# Patient Record
Sex: Female | Born: 1985 | Hispanic: No | Marital: Married | Smoking: Never smoker
Health system: Southern US, Community
[De-identification: ages and names within clinical notes are randomized; demographics above are authoritative.]

## PROBLEM LIST (undated history)

## (undated) DIAGNOSIS — Z789 Other specified health status: Secondary | ICD-10-CM

## (undated) DIAGNOSIS — E039 Hypothyroidism, unspecified: Secondary | ICD-10-CM

## (undated) DIAGNOSIS — K219 Gastro-esophageal reflux disease without esophagitis: Secondary | ICD-10-CM

## (undated) HISTORY — PX: ECTOPIC PREGNANCY SURGERY: SHX613

---

## 2017-01-18 ENCOUNTER — Other Ambulatory Visit (HOSPITAL_COMMUNITY): Payer: Self-pay | Admitting: Obstetrics

## 2017-01-18 DIAGNOSIS — N979 Female infertility, unspecified: Secondary | ICD-10-CM

## 2017-01-24 ENCOUNTER — Encounter (HOSPITAL_COMMUNITY): Payer: Self-pay | Admitting: Radiology

## 2017-01-24 ENCOUNTER — Ambulatory Visit (HOSPITAL_COMMUNITY)
Admission: RE | Admit: 2017-01-24 | Discharge: 2017-01-24 | Disposition: A | Discharge status: Home / Self Care | Payer: BLUE CROSS/BLUE SHIELD | Source: Ambulatory Visit | Attending: Obstetrics | Admitting: Obstetrics

## 2017-01-24 DIAGNOSIS — Z8759 Personal history of other complications of pregnancy, childbirth and the puerperium: Secondary | ICD-10-CM | POA: Diagnosis not present

## 2017-01-24 DIAGNOSIS — N979 Female infertility, unspecified: Secondary | ICD-10-CM | POA: Insufficient documentation

## 2017-01-24 MED ORDER — IOPAMIDOL (ISOVUE-300) INJECTION 61%
30.0000 mL | Freq: Once | INTRAVENOUS | Status: AC | PRN
  Administered 2017-01-24: 30 mL

## 2017-06-14 LAB — OB RESULTS CONSOLE HEPATITIS B SURFACE ANTIGEN: HEP B S AG: NEGATIVE

## 2017-06-14 LAB — OB RESULTS CONSOLE ANTIBODY SCREEN: Antibody Screen: NEGATIVE

## 2017-06-14 LAB — OB RESULTS CONSOLE RUBELLA ANTIBODY, IGM: Rubella: IMMUNE

## 2017-06-14 LAB — OB RESULTS CONSOLE GC/CHLAMYDIA
Chlamydia: NEGATIVE
Gonorrhea: NEGATIVE

## 2017-06-14 LAB — OB RESULTS CONSOLE HIV ANTIBODY (ROUTINE TESTING): HIV: NONREACTIVE

## 2017-06-14 LAB — OB RESULTS CONSOLE ABO/RH: RH TYPE: POSITIVE

## 2017-06-14 LAB — OB RESULTS CONSOLE RPR: RPR: NONREACTIVE

## 2017-08-31 ENCOUNTER — Other Ambulatory Visit (HOSPITAL_COMMUNITY): Payer: Self-pay | Admitting: Obstetrics

## 2017-08-31 DIAGNOSIS — Z3689 Encounter for other specified antenatal screening: Secondary | ICD-10-CM

## 2017-08-31 DIAGNOSIS — Z3A18 18 weeks gestation of pregnancy: Secondary | ICD-10-CM

## 2017-08-31 DIAGNOSIS — O280 Abnormal hematological finding on antenatal screening of mother: Secondary | ICD-10-CM

## 2017-09-05 ENCOUNTER — Encounter (HOSPITAL_COMMUNITY): Payer: Self-pay | Admitting: *Deleted

## 2017-09-07 ENCOUNTER — Ambulatory Visit (HOSPITAL_COMMUNITY)
Admission: RE | Admit: 2017-09-07 | Discharge: 2017-09-07 | Disposition: A | Discharge status: Home / Self Care | Payer: BLUE CROSS/BLUE SHIELD | Source: Ambulatory Visit | Attending: Obstetrics | Admitting: Obstetrics

## 2017-09-07 ENCOUNTER — Encounter (HOSPITAL_COMMUNITY): Payer: Self-pay

## 2017-09-07 DIAGNOSIS — Z3A18 18 weeks gestation of pregnancy: Secondary | ICD-10-CM | POA: Insufficient documentation

## 2017-09-07 DIAGNOSIS — O280 Abnormal hematological finding on antenatal screening of mother: Secondary | ICD-10-CM | POA: Insufficient documentation

## 2017-09-07 DIAGNOSIS — Z3689 Encounter for other specified antenatal screening: Secondary | ICD-10-CM | POA: Insufficient documentation

## 2017-09-07 HISTORY — DX: Other specified health status: Z78.9

## 2017-09-07 NOTE — Progress Notes (Signed)
Genetic Counseling  High-Risk Gestation Note  Appointment Date:  09/07/2017 Referred By: Noland Fordyce, MD Date of Birth:  Apr 20, 1986 Partner: Lori [name]   Pregnancy History: [id number] Estimated Date of Delivery: 02/07/18 Estimated Gestational Age: [redacted]w[redacted]d Attending: Particia Nearing, MD   Mrs. Lori [name] and her partner, Mr. Lori [name], were seen for consultation for genetic counseling because of an elevated MSAFP of 4.39 MoMs based on maternal serum screening.     In summary:  Discussed elevated MSAFP   Reviewed history of current pregnancy  Dichorionic/diamniotic gestation initially with demise of twin B occurring at approximately 10 weeks, noted on 13 week ultrasound  Reviewed possible explanations for elevation  Twin gestation/demise of a twin is most likely explanation of elevated MSAFP in current pregnancy  Reviewed other associated explanations  Discussed additional options  Ultrasound- performed today; visualized fetal anatomy within normal limits; see separate report  Amniocentesis- declined  Discussed associations with unexplained elevated MSAFP  Follow-up growth ultrasound available in early third trimester  Reviewed family history concerns  We reviewed [name]'s maternal serum screening result, the elevation of MSAFP, and the associated reported risk for a fetal open neural tube defect.  Of note, the current pregnancy was initially a dichorionic/diamniotic twin gestation, with demise of twin B at approximately [redacted] weeks gestation.  We reviewed open neural tube defects including: the typical multifactorial etiology and variable prognosis.  In addition, we discussed alternative explanations for an elevated MSAFP including: normal variation, twins, feto-maternal bleeding, a gestational dating error, abdominal wall defects, kidney differences, oligohydramnios, and placental problems.  We discussed that an unexplained elevation of MSAFP is associated with an  increased risk for third trimester complications including: prematurity, low birth weight, and pre-eclampsia.  Given the gestational age at which demise of twin B was visualized on ultrasound and the timing in gestation of when Quad screen was performed, we discussed that the top differential diagnosis suspected for the elevated MSAFP is the demise of twin B in the current pregnancy.   We reviewed additional available screening and diagnostic options including detailed ultrasound and amniocentesis.  We discussed the risks, limitations, and benefits of each. Detailed ultrasound was performed today. Visualized fetal anatomy was within normal limits. Complete ultrasound results reported under separate cover. After thoughtful consideration of these options, [name] elected ultrasound only in the pregnancy and declined amniocentesis.  She understands that ultrasound cannot rule out all birth defects or genetic syndromes.  However, she was counseled that ~90% of fetuses with open neural tube defects can be detected by detailed second trimester ultrasound, when well visualized. A follow-up ultrasound is available to the patient to assess fetal growth in the third trimester given the elevated MSAFP.    [name] was provided with written information regarding cystic fibrosis (CF), spinal muscular atrophy (SMA) and hemoglobinopathies including the carrier frequency, availability of carrier screening and prenatal diagnosis if indicated.  In addition, we discussed that CF and hemoglobinopathies are routinely screened for as part of the [place] newborn screening panel.  She declined screening for CF, SMA and hemoglobinopathies.  Both family histories were reviewed and found to be contributory for a nephew to [name] (his brother's oldest son) who is unable to walk and requires the use of a wheelchair. His features were not described to be progressive. This relative does not have additional medical concerns. An  underlying cause is not known. No additional relatives are reported with similar features including additional children for this brother as well as  children for [name] additional 4 sisters and 4 brothers.  We discussed that there can be many causes for impact on ability to walk including genetics, environment, and multifactorial causes. The reported family history is not suggestive of a particular genetic etiology; however, an underlying genetic etiology cannot be ruled out without additional information.  Without further information regarding the provided family history, an accurate genetic risk cannot be calculated. Further genetic counseling is warranted if more information is obtained.  Mrs. Ace denied exposure to environmental toxins or chemical agents. She denied the use of alcohol, tobacco or street drugs. She denied significant viral illnesses during the course of her pregnancy.   I counseled this couple for approximately 25 minutes regarding the above risks and available options.    Quinn Plowman, MS,  Certified Genetic Counselor 09/07/2017

## 2017-09-12 ENCOUNTER — Other Ambulatory Visit (HOSPITAL_COMMUNITY): Payer: Self-pay

## 2017-11-19 ENCOUNTER — Encounter (HOSPITAL_COMMUNITY): Payer: Self-pay

## 2017-11-19 ENCOUNTER — Inpatient Hospital Stay (HOSPITAL_COMMUNITY)
Admission: AD | Admit: 2017-11-19 | Discharge: 2017-11-19 | Disposition: A | Discharge status: Home / Self Care | Payer: BLUE CROSS/BLUE SHIELD | Source: Ambulatory Visit | Attending: Obstetrics and Gynecology | Admitting: Obstetrics and Gynecology

## 2017-11-19 DIAGNOSIS — O34219 Maternal care for unspecified type scar from previous cesarean delivery: Secondary | ICD-10-CM | POA: Diagnosis not present

## 2017-11-19 DIAGNOSIS — Z79899 Other long term (current) drug therapy: Secondary | ICD-10-CM | POA: Diagnosis not present

## 2017-11-19 DIAGNOSIS — Z9889 Other specified postprocedural states: Secondary | ICD-10-CM | POA: Diagnosis not present

## 2017-11-19 DIAGNOSIS — O99513 Diseases of the respiratory system complicating pregnancy, third trimester: Secondary | ICD-10-CM | POA: Insufficient documentation

## 2017-11-19 DIAGNOSIS — J069 Acute upper respiratory infection, unspecified: Secondary | ICD-10-CM | POA: Insufficient documentation

## 2017-11-19 DIAGNOSIS — O368130 Decreased fetal movements, third trimester, not applicable or unspecified: Secondary | ICD-10-CM | POA: Insufficient documentation

## 2017-11-19 DIAGNOSIS — O368120 Decreased fetal movements, second trimester, not applicable or unspecified: Secondary | ICD-10-CM

## 2017-11-19 DIAGNOSIS — Z3A28 28 weeks gestation of pregnancy: Secondary | ICD-10-CM | POA: Insufficient documentation

## 2017-11-19 LAB — URINALYSIS, ROUTINE W REFLEX MICROSCOPIC
Bilirubin Urine: NEGATIVE
Glucose, UA: NEGATIVE mg/dL
Hgb urine dipstick: NEGATIVE
KETONES UR: NEGATIVE mg/dL
LEUKOCYTES UA: NEGATIVE
NITRITE: NEGATIVE
PH: 7 (ref 5.0–8.0)
PROTEIN: NEGATIVE mg/dL
Specific Gravity, Urine: 1.009 (ref 1.005–1.030)

## 2017-11-19 NOTE — MAU Provider Note (Signed)
History     Chief Complaint  Patient presents with  . Decreased Fetal Movement  . Headache  . Fever  . Nasal Congestion  31 yo G3P1011 MF presents at 2328 4/[redacted] weeks gestation with c/o DFM since yesterday as well as fever yesterday To 99, h/a and nasal congestion. No vaginal bleeding or leakage of fluid. Pt has felt movement since placement In the room   OB History    Gravida Para Term Preterm AB Living   3 1 1   1 1    SAB TAB Ectopic Multiple Live Births       1          Past Medical History:  Diagnosis Date  . Medical history non-contributory     Past Surgical History:  Procedure Laterality Date  . CESAREAN SECTION    . ECTOPIC PREGNANCY SURGERY      History reviewed. No pertinent family history.  Social History   Tobacco Use  . Smoking status: Never Smoker  . Smokeless tobacco: Never Used  Substance Use Topics  . Alcohol use: No  . Drug use: No    Allergies: No Known Allergies  Medications Prior to Admission  Medication Sig Dispense Refill Last Dose  . levothyroxine (SYNTHROID, LEVOTHROID) 75 MCG tablet Take 75 mcg by mouth daily before breakfast.   11/19/2017 at Unknown time  . Prenatal Vit-Fe Fumarate-FA (PRENATAL MULTIVITAMIN) TABS tablet Take 1 tablet by mouth daily at 12 noon.   11/18/2017 at Unknown time  . ranitidine (ZANTAC) 150 MG tablet Take 150 mg by mouth 2 (two) times daily as needed for heartburn.   Past Week at Unknown time     Physical Exam   Blood pressure 113/77, pulse (!) 106, temperature 98.1 F (36.7 C), temperature source Oral, resp. rate 18, last menstrual period 05/02/2017.  General appearance: alert, cooperative and no distress Lungs: clear to auscultation bilaterally Heart: regular rate and rhythm, S1, S2 normal, no murmur, click, rub or gallop Abdomen: gravid nontender Extremities: no edema, redness or tenderness in the calves or thighs   Tracing: baseline 140 (+) accel 160 good variability. One variable decel ED Course   IMP: URI( viral syndrome) affecting pregnancy DFM  IUP @ 28 4/7 weeks P) review kick cts instructions with pt. Pt advised to check on  List for safe med in  Preg. D/c home Keep sched OB appt MDM   Serita KyleSheronette A Ramaya Guile, MD 12:34 PM 11/19/2017

## 2017-11-19 NOTE — Discharge Instructions (Signed)
Daily kick counts. See office website for what can be taken in pregnancy for symptoms related to upper respiratory infection

## 2017-11-19 NOTE — MAU Note (Signed)
Reports FM yesterday evening but nothing since.  No vag bleeding, some occasional upper abdominal tightness  Congestion, fever, headache. Tried tylenol- some relief.

## 2018-01-05 ENCOUNTER — Other Ambulatory Visit: Payer: Self-pay | Admitting: Obstetrics

## 2018-01-17 ENCOUNTER — Encounter (HOSPITAL_COMMUNITY): Payer: Self-pay

## 2018-01-17 ENCOUNTER — Telehealth (HOSPITAL_COMMUNITY): Payer: Self-pay | Admitting: *Deleted

## 2018-01-17 NOTE — Telephone Encounter (Signed)
Preadmission screen  

## 2018-01-18 ENCOUNTER — Encounter (HOSPITAL_COMMUNITY): Payer: Self-pay

## 2018-01-30 NOTE — Patient Instructions (Signed)
Lori [name]  01/30/2018   Your procedure is scheduled on:  02/01/2018  Enter through the Main Entrance of [facility] at 0745 AM.  Pick up the phone at the desk and dial [phone]  Call this number if you have problems the morning of surgery:[phone]  Remember:   Do not eat food:(After Midnight) Desps de medianoche.  Do not drink clear liquids: (After Midnight) Desps de medianoche.  Take these medicines the morning of surgery with A SIP OF WATER: take your synthroid and you may take your zantac   Do not wear jewelry, make-up or nail polish.  Do not wear lotions, powders, or perfumes. Do not wear deodorant.  Do not shave 48 hours prior to surgery.  Do not bring valuables to the hospital.  [facility] is not   responsible for any belongings or valuables brought to the hospital.  Contacts, dentures or bridgework may not be worn into surgery.  Leave suitcase in the car. After surgery it may be brought to your room.  For patients admitted to the hospital, checkout time is 11:00 AM the day of              discharge.    N/A   Please read over the following fact sheets that you were given:   Surgical Site Infection Prevention

## 2018-01-31 ENCOUNTER — Encounter (HOSPITAL_COMMUNITY)
Admission: RE | Admit: 2018-01-31 | Discharge: 2018-01-31 | Disposition: A | Discharge status: Home / Self Care | Payer: BLUE CROSS/BLUE SHIELD | Source: Ambulatory Visit | Attending: Obstetrics | Admitting: Obstetrics

## 2018-01-31 HISTORY — DX: Hypothyroidism, unspecified: E03.9

## 2018-01-31 HISTORY — DX: Gastro-esophageal reflux disease without esophagitis: K21.9

## 2018-01-31 LAB — CBC
HEMATOCRIT: 38.5 % (ref 36.0–46.0)
HEMOGLOBIN: 13 g/dL (ref 12.0–15.0)
MCH: 27.8 pg (ref 26.0–34.0)
MCHC: 33.8 g/dL (ref 30.0–36.0)
MCV: 82.4 fL (ref 78.0–100.0)
Platelets: 297 10*3/uL (ref 150–400)
RBC: 4.67 MIL/uL (ref 3.87–5.11)
RDW: 15.5 % (ref 11.5–15.5)
WBC: 11 10*3/uL — ABNORMAL HIGH (ref 4.0–10.5)

## 2018-01-31 LAB — TYPE AND SCREEN
ABO/RH(D): B POS
Antibody Screen: NEGATIVE

## 2018-01-31 LAB — ABO/RH: ABO/RH(D): B POS

## 2018-01-31 NOTE — H&P (Signed)
Lori SettlerRenuka [name] is a 32 y.o. G3P1011 at 6731w1d presenting for RCS. Pt notes intermittent contractions. Good fetal movement, No vaginal bleeding, not leaking fluid.  [facility] 7 wks - Dated by LMP c/w 7 wk u/s - Femara preg, early twin preg, demise of B at 10 wks. hyperstim with significant pain in 1st trimester - prior c/s, unclear reasoning, 7 yrs ago, also cerclage at 12 wks (by pt report), advised against cerclage this pregnancy -Anxiety - Increased quad for OSB, nl MFM u/s, result likley due to vanishing twin, nl NT u/s.     Prenatal Transfer Tool  Maternal Diabetes: No Genetic Screening: Normal Maternal Ultrasounds/Referrals: Normal Fetal Ultrasounds or other Referrals:  Referred to Materal Fetal Medicine  Maternal Substance Abuse:  No Significant Maternal Medications:  None Significant Maternal Lab Results: None     OB History    Gravida Para Term Preterm AB Living   3 1 1   1 1    SAB TAB Ectopic Multiple Live Births       1         Past Medical History:  Diagnosis Date  . GERD (gastroesophageal reflux disease)   . Hypothyroidism   . Medical history non-contributory    Past Surgical History:  Procedure Laterality Date  . CESAREAN SECTION    . ECTOPIC PREGNANCY SURGERY     Family History: family history is not on file. Social History:  reports that  has never smoked. she has never used smokeless tobacco. She reports that she does not drink alcohol or use drugs.  Review of Systems - Negative except intermittent contractions and pelvic pressure   Physical Exam: Vitals:   02/01/18 0700 02/01/18 0817  BP:  (!) 128/94  Resp:  20  Temp:  (!) 97.5 F (36.4 C)  TempSrc:  Oral  Weight: 63.3 kg (139 lb 8 oz) 63 kg (139 lb)  Height:  5' (1.524 m)    Gen: well appearing, no distress Back: no CVAT Abd: gravid, NT, no RUQ pain LE: no edema, equal bilaterally, non-tender   Prenatal labs: ABO, Rh: --/--/B POS Performed at [facility],  [address] [place], [place] [address]  [phone](03/06 1125) Antibody: NEG (03/06 1122) Rubella: Immune (07/18 0000) RPR: Nonreactive (07/18 0000)  HBsAg: Negative (07/18 0000)  HIV: Non-reactive (07/18 0000)  GBS:   neg 1 hr Glucola 116 Genetic screening nl NT, elevated AFP (difficult to interpret due to 10 wk demise of twin B) Anatomy US normal  CBC    Component Value Date/Time   WBC 11.0 (H) 01/31/2018 1122   RBC 4.67 01/31/2018 1122   HGB 13.0 01/31/2018 1122   HCT 38.5 01/31/2018 1122   PLT 297 01/31/2018 1122   MCV 82.4 01/31/2018 1122   MCH 27.8 01/31/2018 1122   MCHC 33.8 01/31/2018 1122   RDW 15.5 01/31/2018 1122     Assessment/Plan: 32 y.o. G3P1011 at 2931w1d - RCS, R/B d/w pt -Pt unsure about desire for TL, recc against - Anxiety, plan SW consult PP - Hypothyroidism    Lendon ColonelKelly A Laderius Valbuena 01/31/2018, 8:55 PM

## 2018-02-01 ENCOUNTER — Inpatient Hospital Stay (HOSPITAL_COMMUNITY): Payer: BLUE CROSS/BLUE SHIELD | Admitting: Anesthesiology

## 2018-02-01 ENCOUNTER — Encounter (HOSPITAL_COMMUNITY)
Admission: AD | Disposition: A | Discharge status: Home / Self Care | Payer: Self-pay | Source: Ambulatory Visit | Attending: Obstetrics

## 2018-02-01 ENCOUNTER — Other Ambulatory Visit: Payer: Self-pay

## 2018-02-01 ENCOUNTER — Encounter (HOSPITAL_COMMUNITY): Payer: Self-pay | Admitting: *Deleted

## 2018-02-01 ENCOUNTER — Inpatient Hospital Stay (HOSPITAL_COMMUNITY)
Admission: AD | Admit: 2018-02-01 | Discharge: 2018-02-03 | DRG: 787 | Disposition: A | Discharge status: Home / Self Care | Payer: BLUE CROSS/BLUE SHIELD | Source: Ambulatory Visit | Attending: Obstetrics | Admitting: Obstetrics

## 2018-02-01 DIAGNOSIS — K219 Gastro-esophageal reflux disease without esophagitis: Secondary | ICD-10-CM | POA: Diagnosis present

## 2018-02-01 DIAGNOSIS — D62 Acute posthemorrhagic anemia: Secondary | ICD-10-CM | POA: Diagnosis not present

## 2018-02-01 DIAGNOSIS — O99284 Endocrine, nutritional and metabolic diseases complicating childbirth: Secondary | ICD-10-CM | POA: Diagnosis present

## 2018-02-01 DIAGNOSIS — O34211 Maternal care for low transverse scar from previous cesarean delivery: Principal | ICD-10-CM | POA: Diagnosis present

## 2018-02-01 DIAGNOSIS — O9081 Anemia of the puerperium: Secondary | ICD-10-CM | POA: Diagnosis not present

## 2018-02-01 DIAGNOSIS — Z3A39 39 weeks gestation of pregnancy: Secondary | ICD-10-CM

## 2018-02-01 DIAGNOSIS — Z98891 History of uterine scar from previous surgery: Secondary | ICD-10-CM

## 2018-02-01 DIAGNOSIS — E039 Hypothyroidism, unspecified: Secondary | ICD-10-CM | POA: Diagnosis present

## 2018-02-01 DIAGNOSIS — O9962 Diseases of the digestive system complicating childbirth: Secondary | ICD-10-CM | POA: Diagnosis present

## 2018-02-01 LAB — RPR: RPR Ser Ql: NONREACTIVE

## 2018-02-01 SURGERY — Surgical Case
Anesthesia: Spinal

## 2018-02-01 MED ORDER — SIMETHICONE 80 MG PO CHEW
80.0000 mg | CHEWABLE_TABLET | Freq: Three times a day (TID) | ORAL | Status: DC
  Administered 2018-02-01 – 2018-02-03 (×5): 80 mg via ORAL
  Filled 2018-02-01 (×5): qty 1

## 2018-02-01 MED ORDER — OXYTOCIN 10 UNIT/ML IJ SOLN
INTRAMUSCULAR | Status: AC
  Filled 2018-02-01: qty 4

## 2018-02-01 MED ORDER — SODIUM CHLORIDE 0.9 % IR SOLN
Status: DC | PRN
  Administered 2018-02-01: 1

## 2018-02-01 MED ORDER — FAMOTIDINE 20 MG PO TABS
20.0000 mg | ORAL_TABLET | Freq: Two times a day (BID) | ORAL | Status: DC
  Administered 2018-02-02 – 2018-02-03 (×4): 20 mg via ORAL
  Filled 2018-02-01 (×4): qty 1

## 2018-02-01 MED ORDER — LACTATED RINGERS IV SOLN
INTRAVENOUS | Status: DC | PRN
  Administered 2018-02-01 (×2): via INTRAVENOUS

## 2018-02-01 MED ORDER — DIPHENHYDRAMINE HCL 50 MG/ML IJ SOLN: 12.5000 mg | INTRAMUSCULAR | Status: DC | PRN

## 2018-02-01 MED ORDER — KETOROLAC TROMETHAMINE 30 MG/ML IJ SOLN
INTRAMUSCULAR | Status: AC
  Filled 2018-02-01: qty 1

## 2018-02-01 MED ORDER — PHENYLEPHRINE 8 MG IN D5W 100 ML (0.08MG/ML) PREMIX OPTIME
INJECTION | INTRAVENOUS | Status: DC | PRN
  Administered 2018-02-01: 60 ug/min via INTRAVENOUS

## 2018-02-01 MED ORDER — SCOPOLAMINE 1 MG/3DAYS TD PT72
1.0000 | MEDICATED_PATCH | Freq: Once | TRANSDERMAL | Status: DC
  Administered 2018-02-01: 1.5 mg via TRANSDERMAL

## 2018-02-01 MED ORDER — DEXAMETHASONE SODIUM PHOSPHATE 4 MG/ML IJ SOLN
INTRAMUSCULAR | Status: AC
  Filled 2018-02-01: qty 1

## 2018-02-01 MED ORDER — NALOXONE HCL 4 MG/10ML IJ SOLN
1.0000 ug/kg/h | INTRAVENOUS | Status: DC | PRN
  Filled 2018-02-01: qty 5

## 2018-02-01 MED ORDER — FENTANYL CITRATE (PF) 100 MCG/2ML IJ SOLN
INTRAMUSCULAR | Status: DC | PRN
  Administered 2018-02-01: 10 ug via INTRATHECAL

## 2018-02-01 MED ORDER — ONDANSETRON HCL 4 MG/2ML IJ SOLN: 4.0000 mg | Freq: Three times a day (TID) | INTRAMUSCULAR | Status: DC | PRN

## 2018-02-01 MED ORDER — BUPIVACAINE IN DEXTROSE 0.75-8.25 % IT SOLN
INTRATHECAL | Status: DC | PRN
  Administered 2018-02-01: 1.4 mg via INTRATHECAL

## 2018-02-01 MED ORDER — SOD CITRATE-CITRIC ACID 500-334 MG/5ML PO SOLN
ORAL | Status: AC
  Filled 2018-02-01: qty 15

## 2018-02-01 MED ORDER — PHENYLEPHRINE 8 MG IN D5W 100 ML (0.08MG/ML) PREMIX OPTIME
INJECTION | INTRAVENOUS | Status: AC
  Filled 2018-02-01: qty 100

## 2018-02-01 MED ORDER — WITCH HAZEL-GLYCERIN EX PADS: 1.0000 "application " | MEDICATED_PAD | CUTANEOUS | Status: DC | PRN

## 2018-02-01 MED ORDER — DIPHENHYDRAMINE HCL 25 MG PO CAPS: 25.0000 mg | ORAL_CAPSULE | ORAL | Status: DC | PRN

## 2018-02-01 MED ORDER — LACTATED RINGERS IV SOLN: INTRAVENOUS | Status: DC

## 2018-02-01 MED ORDER — IBUPROFEN 600 MG PO TABS
600.0000 mg | ORAL_TABLET | Freq: Four times a day (QID) | ORAL | Status: DC
  Administered 2018-02-01 – 2018-02-03 (×8): 600 mg via ORAL
  Filled 2018-02-01 (×9): qty 1

## 2018-02-01 MED ORDER — FAMOTIDINE IN NACL 20-0.9 MG/50ML-% IV SOLN: 20.0000 mg | Freq: Once | INTRAVENOUS | Status: DC

## 2018-02-01 MED ORDER — NALBUPHINE HCL 10 MG/ML IJ SOLN: 5.0000 mg | Freq: Once | INTRAMUSCULAR | Status: DC | PRN

## 2018-02-01 MED ORDER — ZOLPIDEM TARTRATE 5 MG PO TABS: 5.0000 mg | ORAL_TABLET | Freq: Every evening | ORAL | Status: DC | PRN

## 2018-02-01 MED ORDER — MORPHINE SULFATE (PF) 0.5 MG/ML IJ SOLN
INTRAMUSCULAR | Status: AC
  Filled 2018-02-01: qty 10

## 2018-02-01 MED ORDER — NALBUPHINE HCL 10 MG/ML IJ SOLN: 5.0000 mg | INTRAMUSCULAR | Status: DC | PRN

## 2018-02-01 MED ORDER — NALOXONE HCL 0.4 MG/ML IJ SOLN: 0.4000 mg | INTRAMUSCULAR | Status: DC | PRN

## 2018-02-01 MED ORDER — DIBUCAINE 1 % RE OINT: 1.0000 "application " | TOPICAL_OINTMENT | RECTAL | Status: DC | PRN

## 2018-02-01 MED ORDER — ACETAMINOPHEN 325 MG PO TABS
650.0000 mg | ORAL_TABLET | ORAL | Status: DC | PRN
  Administered 2018-02-02: 650 mg via ORAL
  Filled 2018-02-01 (×2): qty 2

## 2018-02-01 MED ORDER — KETOROLAC TROMETHAMINE 30 MG/ML IJ SOLN: 30.0000 mg | Freq: Four times a day (QID) | INTRAMUSCULAR | Status: DC | PRN

## 2018-02-01 MED ORDER — FAMOTIDINE 20 MG PO TABS: 20.0000 mg | ORAL_TABLET | Freq: Once | ORAL | Status: DC

## 2018-02-01 MED ORDER — SOD CITRATE-CITRIC ACID 500-334 MG/5ML PO SOLN
30.0000 mL | Freq: Once | ORAL | Status: AC
  Administered 2018-02-01: 30 mL via ORAL

## 2018-02-01 MED ORDER — SIMETHICONE 80 MG PO CHEW: 80.0000 mg | CHEWABLE_TABLET | ORAL | Status: DC | PRN

## 2018-02-01 MED ORDER — PHENYLEPHRINE 40 MCG/ML (10ML) SYRINGE FOR IV PUSH (FOR BLOOD PRESSURE SUPPORT)
PREFILLED_SYRINGE | INTRAVENOUS | Status: AC
  Filled 2018-02-01: qty 10

## 2018-02-01 MED ORDER — FENTANYL CITRATE (PF) 100 MCG/2ML IJ SOLN: 25.0000 ug | INTRAMUSCULAR | Status: DC | PRN

## 2018-02-01 MED ORDER — CEFAZOLIN SODIUM-DEXTROSE 2-4 GM/100ML-% IV SOLN
2.0000 g | INTRAVENOUS | Status: AC
  Administered 2018-02-01: 2 g via INTRAVENOUS

## 2018-02-01 MED ORDER — LEVOTHYROXINE SODIUM 75 MCG PO TABS
75.0000 ug | ORAL_TABLET | Freq: Every day | ORAL | Status: DC
  Administered 2018-02-02 – 2018-02-03 (×2): 75 ug via ORAL
  Filled 2018-02-01 (×3): qty 1

## 2018-02-01 MED ORDER — DEXAMETHASONE SODIUM PHOSPHATE 4 MG/ML IJ SOLN
INTRAMUSCULAR | Status: DC | PRN
  Administered 2018-02-01: 4 mg via INTRAVENOUS

## 2018-02-01 MED ORDER — FAMOTIDINE IN NACL 20-0.9 MG/50ML-% IV SOLN
INTRAVENOUS | Status: AC
  Administered 2018-02-01: 20 mg
  Filled 2018-02-01: qty 50

## 2018-02-01 MED ORDER — DIPHENHYDRAMINE HCL 25 MG PO CAPS: 25.0000 mg | ORAL_CAPSULE | Freq: Four times a day (QID) | ORAL | Status: DC | PRN

## 2018-02-01 MED ORDER — MORPHINE SULFATE (PF) 0.5 MG/ML IJ SOLN
INTRAMUSCULAR | Status: DC | PRN
  Administered 2018-02-01: .2 mg via INTRATHECAL

## 2018-02-01 MED ORDER — COCONUT OIL OIL
1.0000 "application " | TOPICAL_OIL | Status: DC | PRN
  Administered 2018-02-03: 1 via TOPICAL
  Filled 2018-02-01: qty 120

## 2018-02-01 MED ORDER — KETOROLAC TROMETHAMINE 30 MG/ML IJ SOLN
30.0000 mg | Freq: Four times a day (QID) | INTRAMUSCULAR | Status: DC | PRN
  Administered 2018-02-01: 30 mg via INTRAMUSCULAR

## 2018-02-01 MED ORDER — PRENATAL MULTIVITAMIN CH
1.0000 | ORAL_TABLET | Freq: Every day | ORAL | Status: DC
  Administered 2018-02-02 – 2018-02-03 (×2): 1 via ORAL
  Filled 2018-02-01 (×2): qty 1

## 2018-02-01 MED ORDER — MEPERIDINE HCL 25 MG/ML IJ SOLN: 6.2500 mg | INTRAMUSCULAR | Status: DC | PRN

## 2018-02-01 MED ORDER — OXYCODONE HCL 5 MG/5ML PO SOLN: 5.0000 mg | Freq: Once | ORAL | Status: DC | PRN

## 2018-02-01 MED ORDER — SCOPOLAMINE 1 MG/3DAYS TD PT72
MEDICATED_PATCH | TRANSDERMAL | Status: AC
  Filled 2018-02-01: qty 1

## 2018-02-01 MED ORDER — SENNOSIDES-DOCUSATE SODIUM 8.6-50 MG PO TABS
2.0000 | ORAL_TABLET | ORAL | Status: DC
  Administered 2018-02-02 (×2): 2 via ORAL
  Filled 2018-02-01 (×2): qty 2

## 2018-02-01 MED ORDER — LACTATED RINGERS IV SOLN
INTRAVENOUS | Status: DC
  Administered 2018-02-01: 14:00:00 via INTRAVENOUS

## 2018-02-01 MED ORDER — TETANUS-DIPHTH-ACELL PERTUSSIS 5-2.5-18.5 LF-MCG/0.5 IM SUSP: 0.5000 mL | Freq: Once | INTRAMUSCULAR | Status: DC

## 2018-02-01 MED ORDER — ONDANSETRON HCL 4 MG/2ML IJ SOLN
INTRAMUSCULAR | Status: DC | PRN
  Administered 2018-02-01: 4 mg via INTRAVENOUS

## 2018-02-01 MED ORDER — SCOPOLAMINE 1 MG/3DAYS TD PT72
1.0000 | MEDICATED_PATCH | Freq: Once | TRANSDERMAL | Status: DC
  Filled 2018-02-01: qty 1

## 2018-02-01 MED ORDER — FENTANYL CITRATE (PF) 100 MCG/2ML IJ SOLN
INTRAMUSCULAR | Status: AC
  Filled 2018-02-01: qty 2

## 2018-02-01 MED ORDER — SODIUM CHLORIDE 0.9% FLUSH: 3.0000 mL | INTRAVENOUS | Status: DC | PRN

## 2018-02-01 MED ORDER — ONDANSETRON HCL 4 MG/2ML IJ SOLN
INTRAMUSCULAR | Status: AC
  Filled 2018-02-01: qty 2

## 2018-02-01 MED ORDER — OXYCODONE HCL 5 MG PO TABS: 5.0000 mg | ORAL_TABLET | Freq: Once | ORAL | Status: DC | PRN

## 2018-02-01 MED ORDER — OXYTOCIN 40 UNITS IN LACTATED RINGERS INFUSION - SIMPLE MED: 2.5000 [IU]/h | INTRAVENOUS | Status: DC

## 2018-02-01 MED ORDER — PROMETHAZINE HCL 25 MG/ML IJ SOLN: 6.2500 mg | INTRAMUSCULAR | Status: DC | PRN

## 2018-02-01 MED ORDER — PHENYLEPHRINE HCL 10 MG/ML IJ SOLN
INTRAMUSCULAR | Status: DC | PRN
  Administered 2018-02-01 (×5): 80 ug via INTRAVENOUS

## 2018-02-01 MED ORDER — OXYTOCIN 10 UNIT/ML IJ SOLN
INTRAVENOUS | Status: DC | PRN
  Administered 2018-02-01: 40 [IU] via INTRAVENOUS

## 2018-02-01 MED ORDER — LACTATED RINGERS IV SOLN
INTRAVENOUS | Status: DC | PRN
  Administered 2018-02-01: 10:00:00 via INTRAVENOUS

## 2018-02-01 MED ORDER — MENTHOL 3 MG MT LOZG: 1.0000 | LOZENGE | OROMUCOSAL | Status: DC | PRN

## 2018-02-01 MED ORDER — SIMETHICONE 80 MG PO CHEW
80.0000 mg | CHEWABLE_TABLET | ORAL | Status: DC
  Administered 2018-02-02 (×2): 80 mg via ORAL
  Filled 2018-02-01 (×2): qty 1

## 2018-02-01 SURGICAL SUPPLY — 42 items
BENZOIN TINCTURE PRP APPL 2/3 (GAUZE/BANDAGES/DRESSINGS) ×3 IMPLANT
CHLORAPREP W/TINT 26ML (MISCELLANEOUS) ×3 IMPLANT
CLAMP CORD UMBIL (MISCELLANEOUS) IMPLANT
CLOSURE STERI-STRIP 1/2X4 (GAUZE/BANDAGES/DRESSINGS) ×1
CLOSURE WOUND 1/2 X4 (GAUZE/BANDAGES/DRESSINGS)
CLOTH BEACON ORANGE TIMEOUT ST (SAFETY) ×3 IMPLANT
CLSR STERI-STRIP ANTIMIC 1/2X4 (GAUZE/BANDAGES/DRESSINGS) ×2 IMPLANT
DRSG OPSITE POSTOP 4X10 (GAUZE/BANDAGES/DRESSINGS) ×3 IMPLANT
ELECT REM PT RETURN 9FT ADLT (ELECTROSURGICAL) ×3
ELECTRODE REM PT RTRN 9FT ADLT (ELECTROSURGICAL) ×1 IMPLANT
EXTRACTOR VACUUM M CUP 4 TUBE (SUCTIONS) IMPLANT
EXTRACTOR VACUUM M CUP 4' TUBE (SUCTIONS)
GAUZE SPONGE 4X4 12PLY STRL (GAUZE/BANDAGES/DRESSINGS) ×3 IMPLANT
GLOVE BIO SURGEON STRL SZ 6.5 (GLOVE) ×2 IMPLANT
GLOVE BIO SURGEONS STRL SZ 6.5 (GLOVE) ×1
GLOVE BIOGEL PI IND STRL 7.0 (GLOVE) ×2 IMPLANT
GLOVE BIOGEL PI INDICATOR 7.0 (GLOVE) ×4
GOWN STRL REUS W/TWL LRG LVL3 (GOWN DISPOSABLE) ×6 IMPLANT
HEMOSTAT ARISTA ABSORB 3G PWDR (MISCELLANEOUS) ×3 IMPLANT
KIT ABG SYR 3ML LUER SLIP (SYRINGE) IMPLANT
NEEDLE HYPO 22GX1.5 SAFETY (NEEDLE) IMPLANT
NEEDLE HYPO 25X5/8 SAFETYGLIDE (NEEDLE) IMPLANT
NS IRRIG 1000ML POUR BTL (IV SOLUTION) ×3 IMPLANT
PACK C SECTION WH (CUSTOM PROCEDURE TRAY) ×3 IMPLANT
PAD ABD 7.5X8 STRL (GAUZE/BANDAGES/DRESSINGS) ×3 IMPLANT
PAD OB MATERNITY 4.3X12.25 (PERSONAL CARE ITEMS) ×3 IMPLANT
PENCIL SMOKE EVAC W/HOLSTER (ELECTROSURGICAL) ×3 IMPLANT
STRIP CLOSURE SKIN 1/2X4 (GAUZE/BANDAGES/DRESSINGS) IMPLANT
SUT MON AB 4-0 PS1 27 (SUTURE) ×3 IMPLANT
SUT PLAIN 0 NONE (SUTURE) IMPLANT
SUT PLAIN 2 0 (SUTURE) ×2
SUT PLAIN 2 0 XLH (SUTURE) IMPLANT
SUT PLAIN ABS 2-0 CT1 27XMFL (SUTURE) ×1 IMPLANT
SUT VIC AB 0 CT1 36 (SUTURE) ×6 IMPLANT
SUT VIC AB 0 CTX 36 (SUTURE) ×6
SUT VIC AB 0 CTX36XBRD ANBCTRL (SUTURE) ×3 IMPLANT
SUT VIC AB 2-0 CT1 27 (SUTURE) ×2
SUT VIC AB 2-0 CT1 TAPERPNT 27 (SUTURE) ×1 IMPLANT
SYR CONTROL 10ML LL (SYRINGE) IMPLANT
TAPE CLOTH SURG 4X10 WHT LF (GAUZE/BANDAGES/DRESSINGS) ×3 IMPLANT
TOWEL OR 17X24 6PK STRL BLUE (TOWEL DISPOSABLE) ×3 IMPLANT
TRAY FOLEY BAG SILVER LF 14FR (SET/KITS/TRAYS/PACK) IMPLANT

## 2018-02-01 NOTE — Anesthesia Postprocedure Evaluation (Signed)
Anesthesia Post Note  Patient: Lori SettlerRenuka Kinnard  Procedure(s) Performed: Repeat CESAREAN SECTION (N/A )     Patient location during evaluation: PACU Anesthesia Type: Spinal Level of consciousness: awake and alert Pain management: pain level controlled Vital Signs Assessment: post-procedure vital signs reviewed and stable Respiratory status: spontaneous breathing and respiratory function stable Cardiovascular status: blood pressure returned to baseline and stable Postop Assessment: spinal receding and no apparent nausea or vomiting Anesthetic complications: no    Last Vitals:  Vitals:   02/01/18 1215 02/01/18 1230  BP: 117/80 109/74  Pulse: (!) 102 (!) 103  Resp: (!) 25 (!) 22  Temp: 36.5 C   SpO2: 99% 99%    Last Pain:  Vitals:   02/01/18 1230  TempSrc:   PainSc: 0-No pain   Pain Goal:                 [name]

## 2018-02-01 NOTE — Op Note (Signed)
02/01/2018  11:03 AM  PATIENT:  Lori [name]  32 y.o. female  PRE-OPERATIVE DIAGNOSIS:  Previous Cesarean Section   POST-OPERATIVE DIAGNOSIS:  Previous Cesarean Section  PROCEDURE:  Procedure(s) with comments: Repeat CESAREAN SECTION (N/A) - EDD: 02/07/18  Repeat low transverse cesarean section with 2 layer closure  SURGEON:  Surgeon(s) and Role:    Noland Fordyce, MD - Primary  PHYSICIAN ASSISTANT:   ASSISTANTS: Mable Fill, CNM   ANESTHESIA:   spinal  EBL:  1172 mL   BLOOD ADMINISTERED:none  DRAINS: Urinary Catheter (Foley)   LOCAL MEDICATIONS USED:  NONE  SPECIMEN:  Source of Specimen:  Placenta  DISPOSITION OF SPECIMEN:  Labor and delivery  COUNTS:  YES  TOURNIQUET:  * No tourniquets in log *  DICTATION: .Note written in EPIC  PLAN OF CARE: Admit to inpatient   PATIENT DISPOSITION:  PACU - hemodynamically stable.   Delay start of Pharmacological VTE agent (>24hrs) due to surgical blood loss or risk of bleeding: yes     Findings:  @BABYSEXEBC @ infant,  APGAR (1 MIN): 9   APGAR (5 MINS): 10   APGAR (10 MINS):   Normal uterus, tubes and ovaries, normal placenta. 3VC, clear amniotic fluid  EBL: Per anesthesia records cc Antibiotics:   2g Ancef Complications: none  Indications: This is a 32 y.o. year-old, G3 P1  At [redacted]w[redacted]d admitted for repeat cesarean section. Risks benefits and alternatives of the procedure were discussed with the patient who agreed to proceed  Procedure:  After informed consent was obtained the patient was taken to the operating room where spinal anesthesia was initiated.  She was prepped and draped in the normal sterile fashion in dorsal supine position with a leftward tilt.  A foley catheter was in place.  A Pfannenstiel skin incision was made 2 cm above the pubic symphysis in the midline with the scalpel.  Dissection was carried down with the Bovie cautery until the fascia was reached. The fascia was incised in the midline. The  incision was extended laterally with the Mayo scissors. The inferior aspect of the fascial incision was grasped with the Coker clamps, elevated up and the underlying rectus muscles were dissected off sharply. The superior aspect of the fascial incision was grasped with the Coker clamps elevated up and the underlying rectus muscles were dissected off sharply.  The peritoneum was entered sharply. The peritoneal incision was extended superiorly and inferiorly with good visualization of the bladder. The bladder blade was inserted and palpation was done to assess the fetal position and the location of the uterine vessels. The lower segment of the uterus was incised sharply with the scalpel and extended  bluntly in the cephalo-caudal fashion. The infant was grasped, brought to the incision,  rotated and the infant was delivered with fundal pressure. The nose and mouth were bulb suctioned. The cord was clamped and cut after 1 minute delay. The infant was handed off to the waiting pediatrician. The placenta was expressed. The uterus was exteriorized. The uterus was cleared of all clots and debris. The uterine incision was repaired with 0 Vicryl in a running locked fashion. Brisk bleeding was noted from the right angle in the midpoint of the incision. Several figure-of-eight sutures were used to slow the bleeding. A second layer of the same suture was used in an imbricating fashion to obtain excellent hemostasis. Significant adhesions were noted around the right fallopian tube which was twisted. Given the patient's right lower quadrant pain through the cesarean section some  of these adhesions were taken down with combination of Metzenbaum scissors and Bovie cautery on cut settings. Hemostasis was noted in this area was checked several times. The mid isthmic portion of the right fallopian tube was absent, likely from prior ectopic surgery.  The uterus was then returned to the abdomen, the gutters were cleared of all clots and  debris. The uterine incision was reinspected and found to be hemostatic. The peritoneum was grasped and closed with 2-0 Vicryl in a running fashion. Midway through the peritoneal closure bright red blood was noted filling the pelvis. Repeat assessment of the incision found the incision to be hemostatic. The angles were addressed carefully. About 10 minutes was spent assessing for a source of bleeding. The right adnexa was reevaluated and no bleeding was  Seen. The underside of the bladder was assessed and no active bleeding either. No bleeders were identified. Bleeding was slowed and Arista was placed along the uterine incision and in the right adnexal region The cut muscle edges and the underside of the fascia were inspected and found to be hemostatic. The fascia was closed with 0 Vicryl in a single layer . The subcutaneous tissue was irrigated. Scarpa's layer was closed with a 2-0 plain gut suture. The skin was closed with a 4-0 Monocryl in a single layer. The patient tolerated the procedure well. Sponge lap and needle counts were correct x3 and patient was taken to the recovery room in a stable condition.  Lendon ColonelKelly A Autumn Pruitt 02/01/2018 11:05 AM

## 2018-02-01 NOTE — Brief Op Note (Signed)
02/01/2018  11:03 AM  PATIENT:  Lori [name]  32 y.o. female  PRE-OPERATIVE DIAGNOSIS:  Previous Cesarean Section   POST-OPERATIVE DIAGNOSIS:  Previous Cesarean Section  PROCEDURE:  Procedure(s) with comments: Repeat CESAREAN SECTION (N/A) - EDD: 02/07/18  Repeat low transverse cesarean section with 2 layer closure  SURGEON:  Surgeon(s) and Role:    Noland Fordyce* Deaunna Olarte, MD - Primary  PHYSICIAN ASSISTANT:   ASSISTANTS: Lori [name], Lori [name]   ANESTHESIA:   spinal  EBL:  1172 mL   BLOOD ADMINISTERED:none  DRAINS: Urinary Catheter (Foley)   LOCAL MEDICATIONS USED:  NONE  SPECIMEN:  Source of Specimen:  Placenta  DISPOSITION OF SPECIMEN:  Labor and delivery  COUNTS:  YES  TOURNIQUET:  * No tourniquets in log *  DICTATION: .Note written in EPIC  PLAN OF CARE: Admit to inpatient   PATIENT DISPOSITION:  PACU - hemodynamically stable.   Delay start of Pharmacological VTE agent (>24hrs) due to surgical blood loss or risk of bleeding: yes  Lendon ColonelKelly A Aideliz Garmany 02/01/2018 11:05 AM

## 2018-02-01 NOTE — Addendum Note (Signed)
Addendum  created 02/01/18 1624 by Elgie CongoMalinova, Elayah Klooster H, CRNA   Sign clinical note

## 2018-02-01 NOTE — Anesthesia Postprocedure Evaluation (Signed)
Anesthesia Post Note  Patient: Lori SettlerRenuka Spanos  Procedure(s) Performed: Repeat CESAREAN SECTION (N/A )     Patient location during evaluation: Mother Baby Anesthesia Type: Spinal Level of consciousness: awake and alert Pain management: pain level controlled Vital Signs Assessment: post-procedure vital signs reviewed and stable Respiratory status: spontaneous breathing, nonlabored ventilation and respiratory function stable Cardiovascular status: stable Postop Assessment: no headache, no backache, spinal receding, adequate PO intake, no apparent nausea or vomiting and patient able to bend at knees Anesthetic complications: no    Last Vitals:  Vitals:   02/01/18 1405 02/01/18 1536  BP: 114/75 110/71  Pulse: 86 90  Resp: 18 18  Temp: 36.9 C 36.9 C  SpO2: 98% 97%    Last Pain:  Vitals:   02/01/18 1536  TempSrc: Axillary  PainSc:    Pain Goal:                 [name]

## 2018-02-01 NOTE — Anesthesia Preprocedure Evaluation (Signed)
Anesthesia Evaluation  Patient identified by MRN, date of birth, ID band Patient awake    Reviewed: Allergy & Precautions, NPO status , Patient's Chart, lab work & pertinent test results  Airway Mallampati: II  TM Distance: >3 FB Neck ROM: Full    Dental  (+) Dental Advisory Given   Pulmonary neg pulmonary ROS,    Pulmonary exam normal breath sounds clear to auscultation       Cardiovascular negative cardio ROS Normal cardiovascular exam Rhythm:Regular Rate:Normal     Neuro/Psych negative neurological ROS  negative psych ROS   GI/Hepatic Neg liver ROS, GERD  Controlled and Medicated,  Endo/Other  Hypothyroidism   Renal/GU negative Renal ROS  negative genitourinary   Musculoskeletal negative musculoskeletal ROS (+)   Abdominal   Peds  Hematology negative hematology ROS (+)   Anesthesia Other Findings   Reproductive/Obstetrics (+) Pregnancy                             Anesthesia Physical Anesthesia Plan  ASA: II  Anesthesia Plan: Spinal   Post-op Pain Management:    Induction:   PONV Risk Score and Plan: Treatment may vary due to age or medical condition  Airway Management Planned: Natural Airway and Nasal Cannula  Additional Equipment: None  Intra-op Plan:   Post-operative Plan:   Informed Consent: I have reviewed the patients History and Physical, chart, labs and discussed the procedure including the risks, benefits and alternatives for the proposed anesthesia with the patient or authorized representative who has indicated his/her understanding and acceptance.   Dental advisory given  Plan Discussed with: CRNA  Anesthesia Plan Comments:         Anesthesia Quick Evaluation

## 2018-02-01 NOTE — Transfer of Care (Signed)
Immediate Anesthesia Transfer of Care Note  Patient: Lori SettlerRenuka [name]  Procedure(s) Performed: Repeat CESAREAN SECTION (N/A )  Patient Location: PACU  Anesthesia Type:Spinal  Level of Consciousness: awake, alert  and oriented  Airway & Oxygen Therapy: Patient Spontanous Breathing  Post-op Assessment: Report given to RN and Post -op Vital signs reviewed and stable  Post vital signs: Reviewed and stable  Last Vitals:  Vitals:   02/01/18 0817 02/01/18 1112  BP: (!) 128/94 115/65  Pulse:  (!) 54  Resp: 20   Temp: (!) 36.4 C   SpO2:  93%    Last Pain:  Vitals:   02/01/18 0817  TempSrc: Oral  PainSc: 0-No pain         Complications: No apparent anesthesia complications

## 2018-02-01 NOTE — Anesthesia Procedure Notes (Signed)
Spinal  Patient location during procedure: OR Start time: 02/01/2018 9:33 AM End time: 02/01/2018 9:37 AM Staffing Anesthesiologist: Beryle LatheBrock, Thomas E, MD Performed: anesthesiologist  Preanesthetic Checklist Completed: patient identified, surgical consent, pre-op evaluation, timeout performed, IV checked, risks and benefits discussed and monitors and equipment checked Spinal Block Patient position: sitting Prep: DuraPrep Patient monitoring: heart rate, cardiac monitor, continuous pulse ox and blood pressure Approach: midline Location: L2-3 Injection technique: single-shot Needle Needle type: Pencan  Needle gauge: 24 G Additional Notes Functioning IV was confirmed and monitors were applied. Sterile prep and drape, including hand hygiene, mask, and sterile gloves were used. The patient was positioned and the spine was prepped. The skin was anesthetized with lidocaine. Free flow of clear CSF was obtained prior to injecting local anesthetic into the CSF. The spinal needle aspirated freely following injection. The needle was carefully withdrawn. The patient tolerated the procedure well. Consent was obtained prior to the procedure with all questions answered and concerns addressed. Risks including, but not limited to, bleeding, infection, nerve damage, paralysis, failed block, inadequate analgesia, allergic reaction, high spinal, itching, and headache were discussed and the patient wished to proceed.  Leslye Peerhomas Brock, MD

## 2018-02-01 NOTE — Lactation Note (Signed)
This note was copied from a baby's chart. Lactation Consultation Note  Patient Name: Lori [name]: 02/01/2018 Reason for consult: Initial assessment   P2, Baby 10 hours old.  Ex BF 1 year. Mother eating dinner and room full of visitors. Mother states baby is latching well and she knows how to hand express. Suggest unwrapping baby to wake to breastfeed. Mom encouraged to feed baby 8-12 times/24 hours and with feeding cues.  Mom made aware of O/P services, breastfeeding support groups, community resources, and our phone # for post-discharge questions.     Maternal Data Has patient been taught Hand Expression?: Yes(per mother) Does the patient have breastfeeding experience prior to this delivery?: Yes  Feeding    LATCH Score                   Interventions    Lactation Tools Discussed/Used     Consult Status Consult Status: Follow-up [name]: 02/02/18 Follow-up type: In-patient    Dahlia ByesBerkelhammer, [facility] 02/01/2018, 8:09 PM

## 2018-02-02 LAB — CBC
HEMATOCRIT: 26.2 % — AB (ref 36.0–46.0)
Hemoglobin: 8.9 g/dL — ABNORMAL LOW (ref 12.0–15.0)
MCH: 28 pg (ref 26.0–34.0)
MCHC: 34 g/dL (ref 30.0–36.0)
MCV: 82.4 fL (ref 78.0–100.0)
PLATELETS: 230 10*3/uL (ref 150–400)
RBC: 3.18 MIL/uL — ABNORMAL LOW (ref 3.87–5.11)
RDW: 15.6 % — AB (ref 11.5–15.5)
WBC: 12.5 10*3/uL — AB (ref 4.0–10.5)

## 2018-02-02 MED ORDER — MAGNESIUM OXIDE 400 (241.3 MG) MG PO TABS
400.0000 mg | ORAL_TABLET | Freq: Every day | ORAL | Status: DC
  Administered 2018-02-02 – 2018-02-03 (×2): 400 mg via ORAL
  Filled 2018-02-02 (×3): qty 1

## 2018-02-02 MED ORDER — OXYCODONE-ACETAMINOPHEN 5-325 MG PO TABS
2.0000 | ORAL_TABLET | ORAL | Status: DC | PRN
  Administered 2018-02-02: 2 via ORAL
  Filled 2018-02-02 (×2): qty 2

## 2018-02-02 MED ORDER — OXYCODONE-ACETAMINOPHEN 5-325 MG PO TABS
1.0000 | ORAL_TABLET | ORAL | Status: DC | PRN
  Administered 2018-02-02 – 2018-02-03 (×4): 1 via ORAL
  Filled 2018-02-02 (×3): qty 1

## 2018-02-02 MED ORDER — FERROUS SULFATE 325 (65 FE) MG PO TABS
325.0000 mg | ORAL_TABLET | Freq: Two times a day (BID) | ORAL | Status: DC
  Administered 2018-02-02 – 2018-02-03 (×2): 325 mg via ORAL
  Filled 2018-02-02 (×2): qty 1

## 2018-02-02 NOTE — Progress Notes (Signed)
POSTOPERATIVE DAY # 1 S/P Repeat LTCS, baby girl "Aanya"   S:         Reports feeling sore and tired             Tolerating po intake / no nausea / no vomiting / no flatus / no BM  Reports some dizziness earlier today when she attempted to ambulate in the halls   Denies  SOB, or CP             Bleeding is moderate             Pain controlled with Motrin and Percocet             Up ad lib / ambulatory/ voiding QS  Newborn breast feeding - reports she has colostrum, but expresses concern that her baby is not getting enough milk.    O:  VS: BP 93/68 (BP Location: Right Arm)   Pulse 90   Temp 98.6 F (37 C) (Oral)   Resp 15   Ht 5' (1.524 m)   Wt 63 kg (139 lb)   LMP 05/02/2017   SpO2 99%   Breastfeeding? Unknown   BMI 27.15 kg/m    LABS:               Recent Labs    01/31/18 1122 02/02/18 0531  WBC 11.0* 12.5*  HGB 13.0 8.9*  PLT 297 230               Bloodtype: --/--/B POS Performed at [facility], [address] [place], [place] [address]  [phone](03/06 1125)  Rubella: Immune (07/18 0000)                                             I&O: Intake/Output      03/07 0701 - 03/08 0700 03/08 0701 - 03/09 0700   P.O. 480    I.V. (mL/kg) 3262.5 (51.8)    Total Intake(mL/kg) 3742.5 (59.4)    Urine (mL/kg/hr) 1625 525 (1.5)   Blood 1172    Total Output 2797 525   Net +945.5 -525                     Physical Exam:             Alert and Oriented X3  Lungs: Clear and unlabored  Heart: regular rate and rhythm / no murmurs  Abdomen: soft, non-tender, mild distention noted; active bowel sounds in all quadrants             Fundus: firm, non-tender, U-1              Dressing: pressure dsg on and attempted to remove, but pt. Uncomfortable with tape; honeycomb dsg with steri-strips c/d/i              Incision:  approximated with sutures / no erythema / no ecchymosis / no drainage  Perineum: intact; mild labial edema noted   Lochia: scant, no clots   Extremities: trace pedal  edema, no calf pain or tenderness  A:        POD # 1 S/P Repeat LTCS            ABL Anemia   Hx. Of Anxiety   Hypothyroidism - on Synthroid 75mcg daily  GERD - on Pepcid   P:  Routine postoperative care              Begin Ferrous Sulfate 325mg  BID  Magnesium oxide 400mg  daily   See lactation - discussed normal colostrum and cluster feeding  Warm liquids and ambulation to promote bowel motility   May shower  D/C IV   Desires early discharge home tomorrow   Carlean Jews, MSN, CNM [facility]

## 2018-02-02 NOTE — Plan of Care (Signed)
  Activity: Ability to tolerate increased activity will improve 02/02/2018 1128 by Karn Cassissborne, Irie Dowson H, RN Note Discussed the importance of ambulating in hallway today in order to increase gas movement. Patient ambulating well in hallway.    Coping: Ability to identify and utilize available resources and services will improve 02/02/2018 1128 by Karn Cassissborne, Claire Bridge H, RN Note Mother verbalizes concern that she does not have enough milk for baby because baby cried a lot during the night until she received her first bottle. Mother able to hand express a good amount of colostrum and RN observed baby latch. Baby latched well and had good strong suck pattern. Baby did seem to pull to end of nipple with shallow latch due to mother relaxing her arms. Encouraged mother to keep baby close to her with a deep latch during the entire feeding in order to transfer more milk and prevent soreness. Discussed the importance of unwrapping baby and doing frequent skin to skin and breast feeding instead of giving formula. Discussed risks of giving formula. Also discussed cluster feeding and the importance of cluster feeding. Discussed that many babies get gassy as well after their first day of life, which could contribute to the fussiness. Demonstrated to mother how to hand express milk during the feeding in order to assist baby get more colostrum while at the breast. Mother also stated that she had an abundant supply of milk with her first child. Discussed with mother that this is a good indication that she will have enough milk with this baby as well. Encouraged mother to call for assistance as needed with latching baby.

## 2018-02-03 MED ORDER — IBUPROFEN 600 MG PO TABS: 600.0000 mg | ORAL_TABLET | Freq: Four times a day (QID) | ORAL | 0 refills | Status: AC

## 2018-02-03 MED ORDER — MAGNESIUM OXIDE 400 (241.3 MG) MG PO TABS: 400.0000 mg | ORAL_TABLET | Freq: Every day | ORAL | Status: AC

## 2018-02-03 MED ORDER — COCONUT OIL OIL: 1.0000 "application " | TOPICAL_OIL | 0 refills | Status: AC | PRN

## 2018-02-03 MED ORDER — SENNOSIDES-DOCUSATE SODIUM 8.6-50 MG PO TABS: 2.0000 | ORAL_TABLET | ORAL | Status: AC

## 2018-02-03 MED ORDER — SIMETHICONE 80 MG PO CHEW: 80.0000 mg | CHEWABLE_TABLET | Freq: Three times a day (TID) | ORAL | 0 refills | Status: AC

## 2018-02-03 MED ORDER — FERROUS SULFATE 325 (65 FE) MG PO TABS: 325.0000 mg | ORAL_TABLET | Freq: Two times a day (BID) | ORAL | 3 refills | Status: AC

## 2018-02-03 MED ORDER — OXYCODONE-ACETAMINOPHEN 5-325 MG PO TABS: 1.0000 | ORAL_TABLET | ORAL | 0 refills | Status: AC | PRN

## 2018-02-03 MED ORDER — ACETAMINOPHEN 325 MG PO TABS: 650.0000 mg | ORAL_TABLET | ORAL | Status: AC | PRN

## 2018-02-03 NOTE — Progress Notes (Signed)
S:  Reports feeling well. Soreness at surgical site  Tolerating PO liquids and solids     Dizziness with narcotic use  Pain managed with PO meds   Small rubra, no clots  Feet feel swollen  O: General: A/O x3, NAD  Abd soft, honeycomb dressing clean/D/I  Uterus firm, midline  Lochia: small rubra, no clots  Extremities: trace bilat pedal edema, no cords or calf tenderness Vitals:   02/02/18 1733 02/03/18 0603  BP: (!) 96/59 126/89  Pulse: 81 94  Resp: 16 18  Temp: 98.8 F (37.1 C) 98.3 F (36.8 C)  SpO2:  100%   CBC Latest Ref Rng & Units 02/02/2018 01/31/2018  WBC 4.0 - 10.5 K/uL 12.5(H) 11.0(H)  Hemoglobin 12.0 - 15.0 g/dL 9.6(E8.9(L) 45.413.0  Hematocrit 36.0 - 46.0 % 26.2(L) 38.5  Platelets 150 - 400 K/uL 230 297   I/O last 3 completed shifts: In: 1062.5 [I.V.:1062.5] Out: 1500 [Urine:1500] No intake/output data recorded.   A:  Patient Active Problem List   Diagnosis Date Noted  . Status post repeat low transverse cesarean section 02/01/2018  . Postpartum care following cesarean delivery (3/7) 02/01/2018  .  09/07/2017  .     Stable postpartum Breastfeeding Hypothyroid, stable on Synthroid  P:  Discharge home  Follow-up with Dr. Ernestina PennaFogleman in 6 weeks  Routine post-op care   Instructions reviewed and booklet given  Roma SchanzVivian B Khaliya Golinski, Student-MidWife

## 2018-02-03 NOTE — Discharge Summary (Signed)
OB Discharge Summary     Patient Name: Lori [name] DOB: [birth year] MRN: [id number]  Date of admission: 02/01/2018 Delivering MD: Noland Fordyce   Date of discharge: 02/03/2018  Admitting diagnosis: Previous Cesarean Section Intrauterine pregnancy: [redacted]w[redacted]d     Secondary diagnosis:  Principal Problem:   Postpartum care following cesarean delivery (3/7) Active Problems:   Status post repeat low transverse cesarean section Hypothyroidism Additional problems: None     Discharge diagnosis: Term Pregnancy Delivered, Anemia and Hypothyroidism                                                                                                Post partum procedures:None  Complications: None  Hospital course:  Sceduled C/S   32 y.o. yo [id number] at [redacted]w[redacted]d was admitted to the hospital 02/01/2018 for scheduled cesarean section with the following indication:Elective Repeat.  Membrane Rupture Time/Date: 10:02 AM ,02/01/2018   Patient delivered a Viable infant.02/01/2018  Details of operation can be found in separate operative note.  Pateint had an uncomplicated postpartum course.  She is ambulating, tolerating a regular diet, passing flatus, and urinating well. Patient is discharged home in stable condition on  02/03/18         Physical exam  Vitals:   02/02/18 0524 02/02/18 1000 02/02/18 1733 02/03/18 0603  BP: (!) 105/57 93/68 (!) 96/59 126/89  Pulse: 75 90 81 94  Resp: 16 15 16 18   Temp: 98.4 F (36.9 C) 98.6 F (37 C) 98.8 F (37.1 C) 98.3 F (36.8 C)  TempSrc: Oral Oral Oral Oral  SpO2:  99%  100%  Weight:      Height:       General: alert, cooperative and no distress Lochia: appropriate Uterine Fundus: firm Incision: Healing well with no significant drainage, Dressing is clean, dry, and intact DVT Evaluation: No evidence of DVT seen on physical exam. No cords or calf tenderness. Labs: Lab Results  Component Value Date   WBC 12.5 (H) 02/02/2018   HGB 8.9 (L) 02/02/2018   HCT 26.2  (L) 02/02/2018   MCV 82.4 02/02/2018   PLT 230 02/02/2018   No flowsheet data found.  Discharge instruction: per After Visit Summary and "Baby and Me Booklet".  After visit meds:  Allergies as of 02/03/2018   No Known Allergies     Medication List    TAKE these medications   acetaminophen 325 MG tablet Commonly known as:  TYLENOL Take 2 tablets (650 mg total) by mouth every 4 (four) hours as needed (for pain scale < 4).   coconut oil Oil Apply 1 application topically as needed.   ferrous sulfate 325 (65 FE) MG tablet Take 1 tablet (325 mg total) by mouth 2 (two) times daily with a meal.   ibuprofen 600 MG tablet Commonly known as:  ADVIL,MOTRIN Take 1 tablet (600 mg total) by mouth every 6 (six) hours.   levothyroxine 75 MCG tablet Commonly known as:  SYNTHROID, LEVOTHROID Take 75 mcg by mouth daily before breakfast.   magnesium oxide 400 (241.3 Mg) MG tablet Commonly known as:  MAG-OX Take 1 tablet (  400 mg total) by mouth daily.   oxyCODONE-acetaminophen 5-325 MG tablet Commonly known as:  PERCOCET/ROXICET Take 1 tablet by mouth every 4 (four) hours as needed for moderate pain (Pain scale 4-7).   prenatal multivitamin Tabs tablet Take 1 tablet by mouth daily.   ranitidine 150 MG tablet Commonly known as:  ZANTAC Take 150 mg by mouth 2 (two) times daily as needed for heartburn.   senna-docusate 8.6-50 MG tablet Commonly known as:  Senokot-S Take 2 tablets by mouth daily. Start taking on:  02/04/2018   simethicone 80 MG chewable tablet Commonly known as:  MYLICON Chew 1 tablet (80 mg total) by mouth 3 (three) times daily after meals.       Diet: routine diet  Activity: Advance as tolerated. Pelvic rest for 6 weeks.   Outpatient follow up:6 weeks  Postpartum contraception: Not Discussed  Newborn Data: Live born female "Aanya" Birth Weight: 6 lb 15.5 oz (3160 g) APGAR: 9, 10  Newborn Delivery   Birth date/time:  02/01/2018 10:02:00 Delivery type:   C-Section, Low Transverse C-section categorization:  Repeat     Baby Feeding: Breast Disposition:home with mother   02/03/2018 Neta Mendsaniela C Ladd Cen, CNM

## 2018-02-03 NOTE — Lactation Note (Signed)
This note was copied from a baby's chart. Lactation Consultation Note  Patient Name: Lori Sharion SettlerRenuka Cancro ZOXWR'UToday's Date: 02/03/2018 Reason for consult: Follow-up assessment   P2, Baby 49 hours old.   Mother worried about her milk supply so she gave bottles of formula during the night. Mother's breasts are filling. Reviewed hand expression with good flow of colostrum. Provided mother w/ manual pump. Suggest breastfeeding before offering formula to help establish her milk supply. Mom encouraged to feed baby 8-12 times/24 hours and with feeding cues.  Reviewed engorgement care and monitoring voids/stools.    Maternal Data Has patient been taught Hand Expression?: Yes  Feeding Feeding Type: Breast Fed Length of feed: 30 min  LATCH Score                   Interventions    Lactation Tools Discussed/Used     Consult Status Consult Status: Complete    Hardie PulleyBerkelhammer, Ayris Carano Boschen 02/03/2018, 11:33 AM

## 2018-02-03 NOTE — Progress Notes (Signed)
MOB was referred for history of depression/anxiety. * Referral screened out by Clinical Social Worker because none of the following criteria appear to apply: ~ History of anxiety/depression during this pregnancy, or of post-partum depression. ~ Diagnosis of anxiety and/or depression within last 3 years OR * MOB's symptoms currently being treated with medication and/or therapy.  MOB anxiety was situational and per bedside nurse MOB did not feel like she needed to meet with CSW.  MOB's EDPS was a 1.  Please contact the Clinical Social Worker if needs arise or by MOB request.   Blaine HamperAngel Boyd-Gilyard, MSW, LCSW Clinical Social Work [phone]

## 2019-03-08 IMAGING — US US MFM OB DETAIL+14 WK
1 series · 14 of 28 positions shown · non-contrast
Comparison: none

[Series 1: us mfm ob detail+14 wk · 103 acquisitions, 14 frames shown]
[im 4/103]
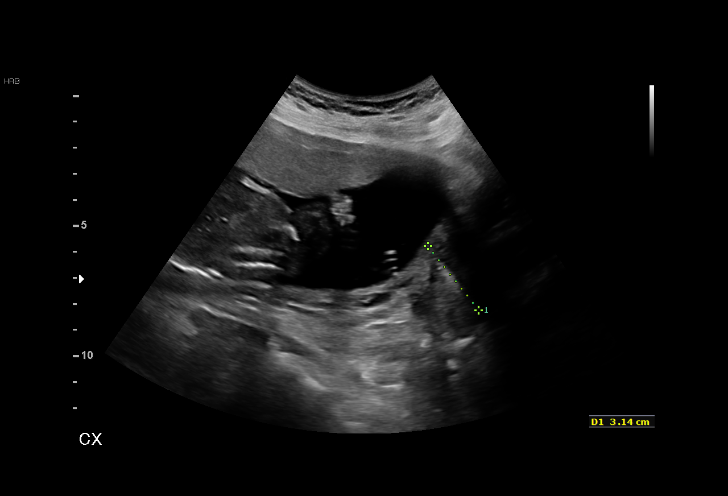
[im 12/103]
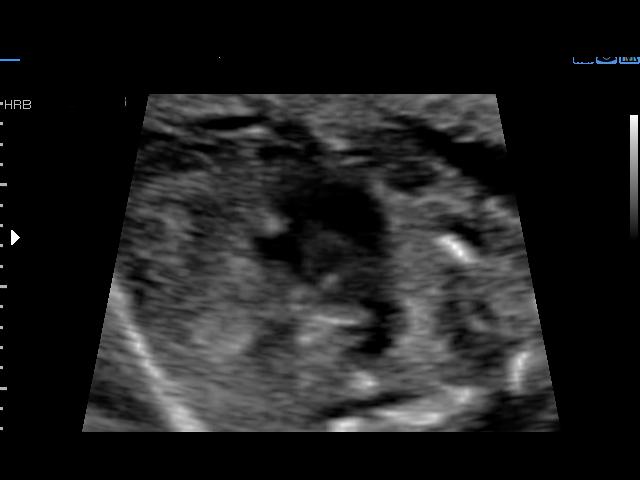
[im 19/103]
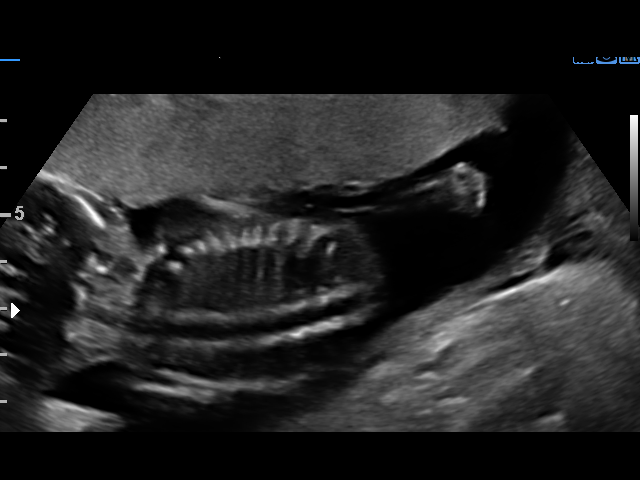
[im 27/103]
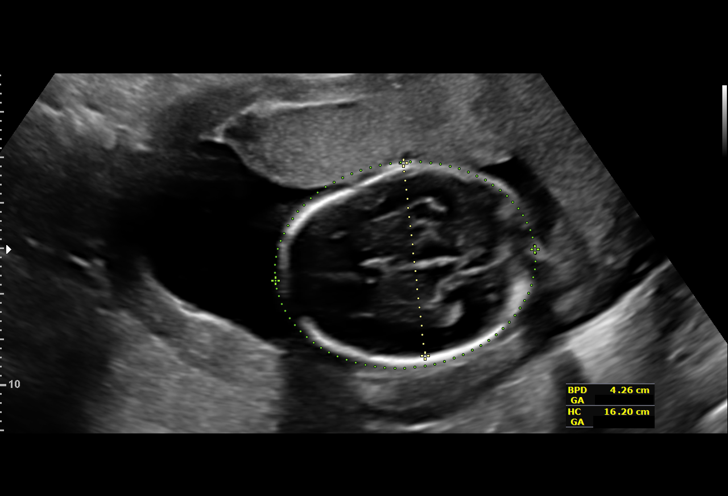
[im 35/103]
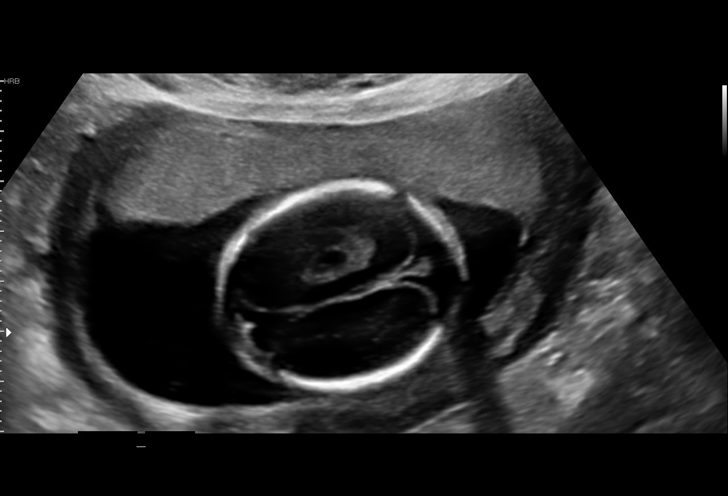
[im 42/103]
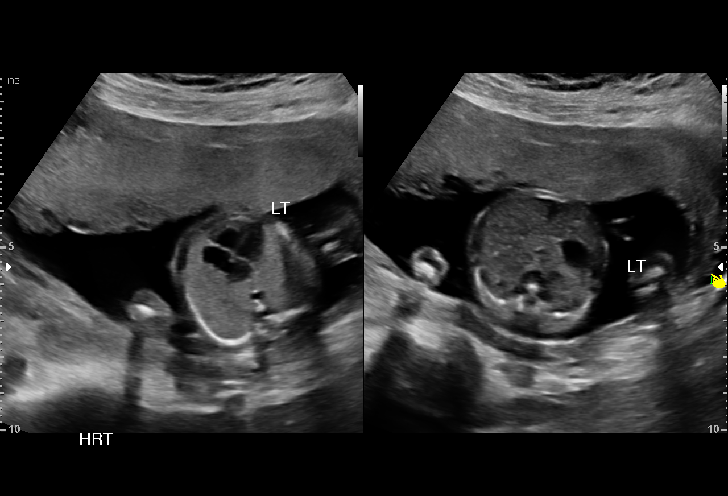
[im 50/103]
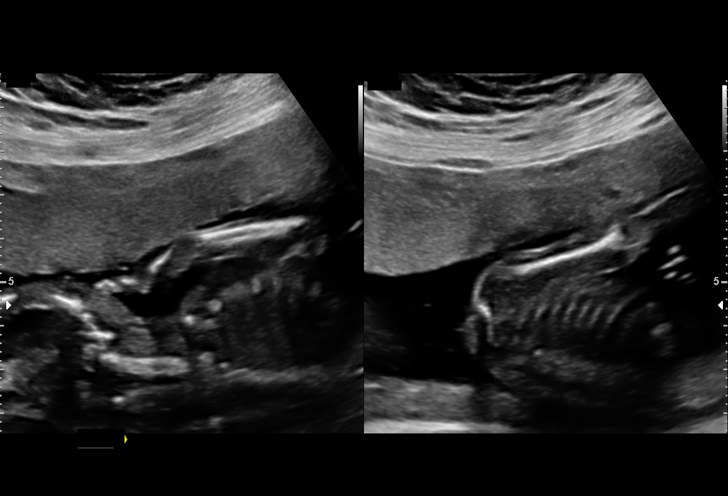
[im 57/103]
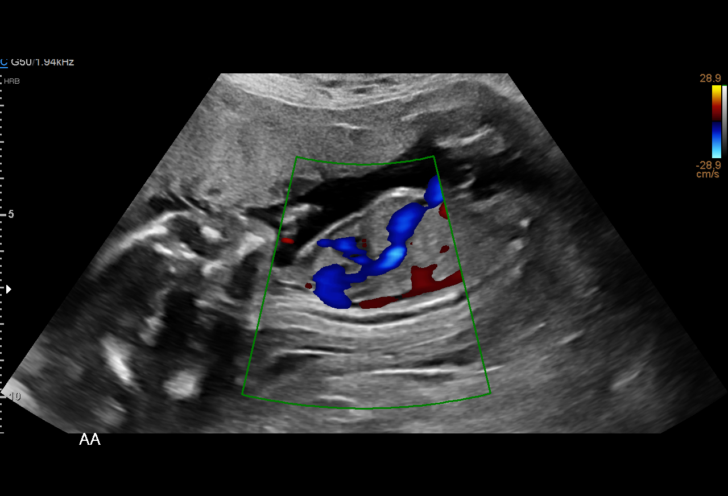
[im 65/103]
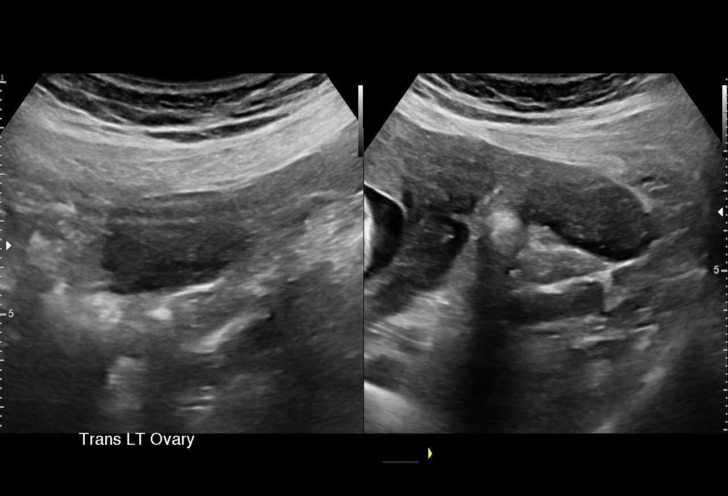
[im 72/103]
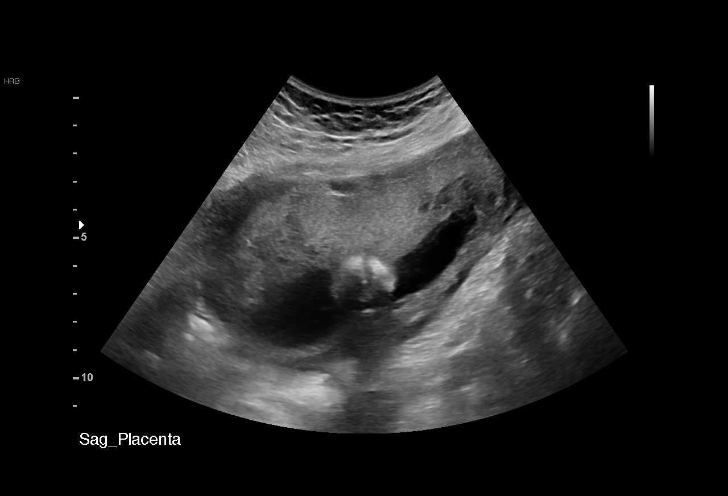
[im 80/103]
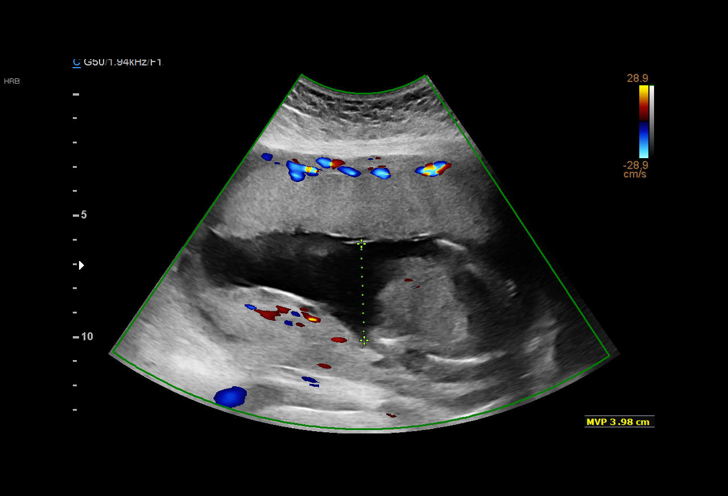
[im 87/103]
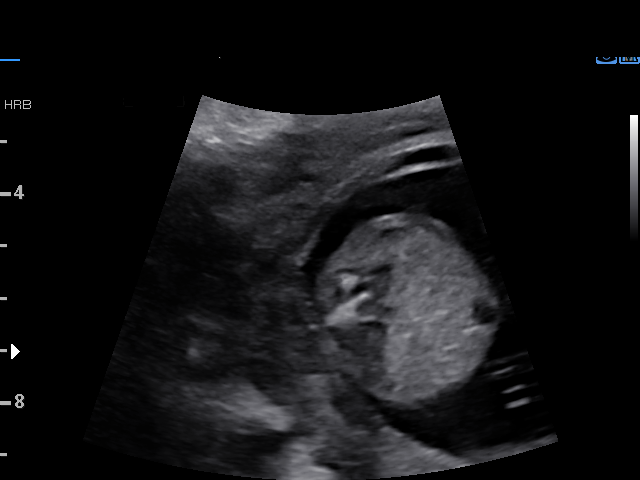
[im 95/103]
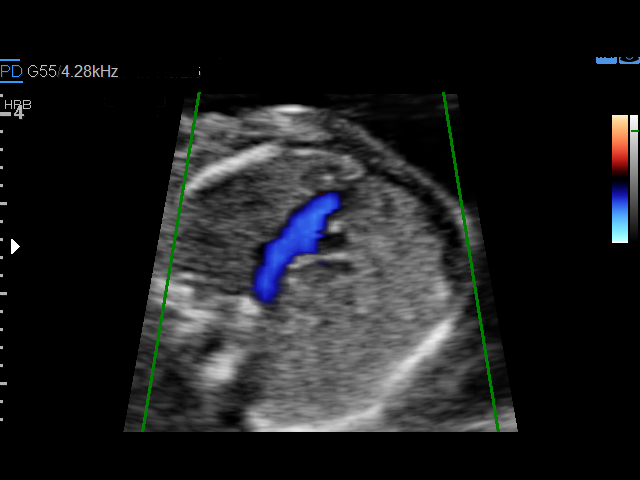
[im 103/103]
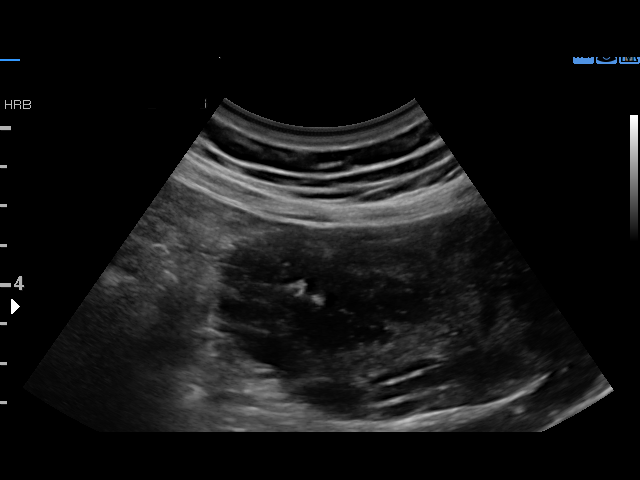

[14 of 28 positions shown; findings below may reference images not displayed]

OB[facility]N &
Infertility
[address] [HOSPITAL]

[address]  [address] TIGER           [id number]      [id number]     [id number]
Indications

18 weeks gestation of pregnancy
Abnormal biochemical finding on antenatal
screening of mother
Antenatal screening for elevated
alphafetoprotein level (MSAFP 4.37 MoM)
Encounter for fetal anatomic survey
Twin pregnancy with loss (demise) of one       O30.009,
fetus, antepartum
OB History

Blood Type:            Height:         Weight (lb):  119       BMI:
Gravidity:    3         Term:   1
Ectopic:      1        Living:  1
Fetal Evaluation

Num Of Fetuses:     1
Fetal Heart         149
Rate(bpm):
Cardiac Activity:   Observed
Presentation:       Breech
Placenta:           Anterior, above cervical os
P. Cord Insertion:  Visualized, central

Amniotic Fluid
AFI FV:      Subjectively within normal limits

Largest Pocket(cm)
3.98
Biometry

BPD:      42.9  mm     G. Age:  19w 0d         84  %    CI:        72.23   %    70 - 86
FL/HC:      16.0   %    15.8 - 18
HC:      160.6  mm     G. Age:  18w 6d         77  %    HC/AC:      1.16        1.07 -
AC:      138.3  mm     G. Age:  19w 2d         81  %    FL/BPD:     59.9   %
FL:       25.7  mm     G. Age:  17w 6d         32  %    FL/AC:      18.6   %    20 - 24
HUM:      27.4  mm     G. Age:  18w 5d         72  %
CER:      17.7  mm     G. Age:  17w 5d         38  %
NFT:       5.8  mm

CM:        5.2  mm

Est. FW:     248  gm      0 lb 9 oz     56  %
Gestational Age

U/S Today:     18w 5d                                        EDD:   02/03/18
Best:          18w 1d     Det. By:  Early Ultrasound         EDD:   02/07/18
Anatomy

Cranium:               Appears normal         Aortic Arch:            Appears normal
Cavum:                 Appears normal         Ductal Arch:            Appears normal
Ventricles:            Appears normal         Diaphragm:              Appears normal
Choroid Plexus:        Appears normal         Stomach:                Appears normal, left
sided
Cerebellum:            Appears normal         Abdomen:                Appears normal
Posterior Fossa:       Appears normal         Abdominal Wall:         Appears nml (cord
insert, abd wall)
Nuchal Fold:           Appears normal         Cord Vessels:           Appears normal (3
vessel cord)
Face:                  Appears normal         Kidneys:                Appear normal
(orbits and profile)
Lips:                  Appears normal         Bladder:                Appears normal
Thoracic:              Appears normal         Spine:                  Appears normal
Heart:                 Appears normal         Upper Extremities:      Appears normal
(4CH, axis, and situs
RVOT:                  Appears normal         Lower Extremities:      Appears normal
LVOT:                  Appears normal

Other:  Female gender. Technically difficult due to fetal position.
Cervix Uterus Adnexa

Cervix
Length:           3.14  cm.
Normal appearance by transabdominal scan.

Uterus
No abnormality visualized.

Left Ovary
Within normal limits.

Right Ovary
Within normal limits.

Cul De Sac:   No free fluid seen.

Adnexa:       No abnormality visualized.
Impression

SIUP at 18+1 weeks
Normal detailed fetal anatomy
Markers of aneuploidy: none
Normal amniotic fluid volume
Measurements consistent with early US (7 weeks)
Demised co-twin still visible

The US findings were shared with Ms. Moatshe. The
implications of an elevated MSAFP were discussed in detail.
Fortunately, no structural fetal abnormalities were identified to
explain the elevated MSAFP. Specifically, the spine, posterior
fossa and abdominal wall were seen and appeared normal.
Most likely, the MSAFP was elevated due to the co-twin.
However, I recommend following the pregnancy as you would
for an elevated, unexplained MSAFP.
Recommendations

Serial Daniella for growth in the third trimester

## 2020-02-27 ENCOUNTER — Ambulatory Visit: Payer: BLUE CROSS/BLUE SHIELD | Attending: Internal Medicine

## 2020-02-27 DIAGNOSIS — Z23 Encounter for immunization: Secondary | ICD-10-CM

## 2020-02-27 NOTE — Progress Notes (Signed)
   Covid-19 Vaccination Clinic  Name:  Sanika Brosious    MRN: [id number] DOB: [birth year]  02/27/2020  Ms. Ke was observed post Covid-19 immunization for 15 minutes without incident. She was provided with Vaccine Information Sheet and instruction to access the V-Safe system.   Ms. Negrette was instructed to call 911 with any severe reactions post vaccine: Marland Kitchen Difficulty breathing  . Swelling of face and throat  . A fast heartbeat  . A bad rash all over body  . Dizziness and weakness   Immunizations Administered    Name Date Dose VIS Date Route   Pfizer COVID-19 Vaccine 02/27/2020  8:54 AM 0.3 mL 11/08/2019 Intramuscular   Manufacturer: [employer], [employer]   Lot: [id number]   NDC: [address]-9

## 2020-03-23 ENCOUNTER — Ambulatory Visit: Payer: BLUE CROSS/BLUE SHIELD | Attending: Internal Medicine

## 2020-03-23 DIAGNOSIS — Z23 Encounter for immunization: Secondary | ICD-10-CM

## 2020-03-23 NOTE — Progress Notes (Signed)
   Covid-19 Vaccination Clinic  Name:  Evonna Stoltz    MRN: [id number] DOB: 1986-03-23  03/23/2020  Ms. Magner was observed post Covid-19 immunization for 15 minutes without incident. She was provided with Vaccine Information Sheet and instruction to access the V-Safe system.   Ms. Trostel was instructed to call 911 with any severe reactions post vaccine: Marland Kitchen Difficulty breathing  . Swelling of face and throat  . A fast heartbeat  . A bad rash all over body  . Dizziness and weakness   Immunizations Administered    Name Date Dose VIS Date Route   Pfizer COVID-19 Vaccine 03/23/2020  1:25 PM 0.3 mL 01/22/2019 Intramuscular   Manufacturer: [employer], [employer]   Lot: [id number]   NDC: [address]-9
